# Patient Record
Sex: Male | Born: 1955 | Race: White | Hispanic: No | Marital: Married
Health system: Midwestern US, Academic
[De-identification: ages and names within clinical notes are randomized; demographics above are authoritative.]

---

## 2017-12-22 ENCOUNTER — Encounter: Admit: 2017-12-22 | Discharge: 2017-12-22

## 2017-12-22 ENCOUNTER — Encounter: Admit: 2017-12-22 | Discharge: 2017-12-23

## 2017-12-22 DIAGNOSIS — S52511A Displaced fracture of right radial styloid process, initial encounter for closed fracture: ICD-10-CM

## 2017-12-22 DIAGNOSIS — I10 Essential (primary) hypertension: Principal | ICD-10-CM

## 2017-12-22 DIAGNOSIS — S52611A Displaced fracture of right ulna styloid process, initial encounter for closed fracture: Secondary | ICD-10-CM

## 2017-12-22 DIAGNOSIS — S471XXA Crushing injury of right shoulder and upper arm, initial encounter: ICD-10-CM

## 2017-12-22 DIAGNOSIS — E079 Disorder of thyroid, unspecified: ICD-10-CM

## 2017-12-22 MED ORDER — DIPHTH,PERTUS(ACELL),TETANUS 2.5-8-5 LF-MCG-LF/0.5ML IM SUSP
.5 mL | Freq: Once | INTRAMUSCULAR | 0 refills | Status: CP
Start: 2017-12-22 — End: ?
  Administered 2017-12-23: 16:00:00 0.5 mL via INTRAMUSCULAR

## 2017-12-22 MED ORDER — OXYCODONE-ACETAMINOPHEN 5-325 MG PO TAB
1 | ORAL | 0 refills | Status: DC | PRN
Start: 2017-12-22 — End: 2017-12-24
  Administered 2017-12-23 – 2017-12-24 (×6): 1 via ORAL

## 2017-12-22 MED ORDER — FENTANYL CITRATE (PF) 50 MCG/ML IJ SOLN
25 ug | INTRAVENOUS | 0 refills | Status: DC | PRN
Start: 2017-12-22 — End: 2017-12-24
  Administered 2017-12-23 (×3): 25 ug via INTRAVENOUS

## 2017-12-22 MED ORDER — ENOXAPARIN 40 MG/0.4 ML SC SYRG
40 mg | Freq: Every day | SUBCUTANEOUS | 0 refills | Status: DC
Start: 2017-12-22 — End: 2017-12-24
  Administered 2017-12-24: 03:00:00 40 mg via SUBCUTANEOUS

## 2017-12-22 MED ORDER — LACTATED RINGERS IV SOLP
INTRAVENOUS | 0 refills | Status: AC
Start: 2017-12-22 — End: ?
  Administered 2017-12-23 (×2): 1000.000 mL via INTRAVENOUS

## 2017-12-23 ENCOUNTER — Inpatient Hospital Stay
Admit: 2017-12-23 | Discharge: 2017-12-24 | Disposition: A | Discharge status: Home / Self Care | Payer: BC Managed Care – PPO | Source: Other Acute Inpatient Hospital

## 2017-12-23 ENCOUNTER — Encounter: Admit: 2017-12-23 | Discharge: 2017-12-23

## 2017-12-23 ENCOUNTER — Inpatient Hospital Stay: Admit: 2017-12-23 | Discharge: 2017-12-23

## 2017-12-23 ENCOUNTER — Encounter: Admit: 2017-12-23 | Discharge: 2017-12-24

## 2017-12-23 DIAGNOSIS — E079 Disorder of thyroid, unspecified: ICD-10-CM

## 2017-12-23 DIAGNOSIS — I10 Essential (primary) hypertension: Principal | ICD-10-CM

## 2017-12-23 LAB — CBC
Lab: 12 10*3/uL — ABNORMAL HIGH (ref 4.5–11.0)
Lab: 11 10*3/uL — ABNORMAL HIGH (ref 4.5–11.0)
Lab: 13 % (ref 11–15)
Lab: 14 g/dL (ref 13.5–16.5)
Lab: 34 g/dL (ref 32.0–36.0)
Lab: 367 10*3/uL (ref 150–400)
Lab: 4.5 M/UL (ref 4.4–5.5)
Lab: 7.5 FL (ref 7–11)
Lab: 93 FL (ref 80–100)

## 2017-12-23 LAB — BASIC METABOLIC PANEL
Lab: 1.6 mg/dL — ABNORMAL HIGH (ref 0.4–1.24)
Lab: 106 MMOL/L (ref 98–110)
Lab: 11 g/dL (ref 3–12)
Lab: 111 mg/dL — ABNORMAL HIGH (ref 70–100)
Lab: 138 MMOL/L (ref 137–147)
Lab: 21 MMOL/L (ref 21–30)
Lab: 43 mL/min — ABNORMAL LOW (ref >60)
Lab: 45 mg/dL — ABNORMAL HIGH (ref 7–25)
Lab: 52 mL/min — ABNORMAL LOW (ref >60)
Lab: 9.4 mg/dL (ref 8.5–10.6)
Lab: 135 MMOL/L — ABNORMAL LOW (ref >40)
Lab: 1.3 mg/dL — ABNORMAL HIGH (ref 0.4–1.24)
Lab: 101 mg/dL — ABNORMAL HIGH (ref 70–100)
Lab: 107 MMOL/L (ref 98–110)
Lab: 137 MMOL/L (ref 137–147)
Lab: 20 MMOL/L — ABNORMAL LOW (ref 21–30)
Lab: 35 mg/dL — ABNORMAL HIGH (ref 7–25)
Lab: 4.1 MMOL/L (ref 3.5–5.1)
Lab: 56 mL/min — ABNORMAL LOW (ref >60)
Lab: 9 mg/dL (ref 8.5–10.6)
Lab: >60 mL/min (ref >60)
Lab: 10 (ref 3–12)

## 2017-12-23 LAB — PTT (APTT)
Lab: 30 s (ref 24.0–36.5)
Lab: 23 s — ABNORMAL LOW (ref 24.0–36.5)

## 2017-12-23 LAB — CREATINE KINASE-CPK: Lab: 118 U/L (ref 35–232)

## 2017-12-23 LAB — PROTIME INR (PT)
Lab: 1 MMOL/L (ref 0.8–1.2)
Lab: 1 MMOL/L — ABNORMAL HIGH (ref >60)

## 2017-12-23 MED ORDER — ROPIVACAINE (PF) 5 MG/ML (0.5 %) IJ SOLN
0 refills | Status: DC
Start: 2017-12-23 — End: 2017-12-23
  Administered 2017-12-23: 23:00:00 15 mL

## 2017-12-23 MED ORDER — DEXTRAN 70-HYPROMELLOSE (PF) 0.1-0.3 % OP DPET
0 refills | Status: DC
Start: 2017-12-23 — End: 2017-12-23
  Administered 2017-12-23: 19:00:00 2 [drp] via OPHTHALMIC

## 2017-12-23 MED ORDER — PROPOFOL INJ 10 MG/ML IV VIAL
0 refills | Status: DC
Start: 2017-12-23 — End: 2017-12-23
  Administered 2017-12-23: 19:00:00 200 mg via INTRAVENOUS

## 2017-12-23 MED ORDER — CEFAZOLIN 1 GRAM IJ SOLR
0 refills | Status: DC
Start: 2017-12-23 — End: 2017-12-23
  Administered 2017-12-23: 20:00:00 2 g via INTRAVENOUS

## 2017-12-23 MED ORDER — FENTANYL CITRATE (PF) 50 MCG/ML IJ SOLN
25 ug | INTRAVENOUS | 0 refills | Status: DC | PRN
Start: 2017-12-23 — End: 2017-12-23

## 2017-12-23 MED ORDER — DEXAMETHASONE SODIUM PHOSPHATE 4 MG/ML IJ SOLN
INTRAVENOUS | 0 refills | Status: DC
Start: 2017-12-23 — End: 2017-12-23
  Administered 2017-12-23: 20:00:00 4 mg via INTRAVENOUS

## 2017-12-23 MED ORDER — OXYCODONE 5 MG PO TAB
5-10 mg | Freq: Once | ORAL | 0 refills | Status: DC | PRN
Start: 2017-12-23 — End: 2017-12-23

## 2017-12-23 MED ORDER — ONDANSETRON HCL (PF) 4 MG/2 ML IJ SOLN
INTRAVENOUS | 0 refills | Status: DC
Start: 2017-12-23 — End: 2017-12-23
  Administered 2017-12-23: 21:00:00 4 mg via INTRAVENOUS

## 2017-12-23 MED ORDER — LACTATED RINGERS IV SOLP
1000 mL | INTRAVENOUS | 0 refills | Status: DC
Start: 2017-12-23 — End: 2017-12-24
  Administered 2017-12-23: 19:00:00 1000 mL via INTRAVENOUS

## 2017-12-23 MED ORDER — FENTANYL CITRATE (PF) 50 MCG/ML IJ SOLN
50 ug | INTRAVENOUS | 0 refills | Status: DC | PRN
Start: 2017-12-23 — End: 2017-12-23

## 2017-12-23 MED ORDER — KETAMINE 10 MG/ML IJ SOLN
0 refills | Status: DC
Start: 2017-12-23 — End: 2017-12-23
  Administered 2017-12-23: 19:00:00 30 mg via INTRAVENOUS

## 2017-12-23 MED ORDER — HYDROMORPHONE (PF) 2 MG/ML IJ SYRG
.5-1 mg | INTRAVENOUS | 0 refills | Status: DC | PRN
Start: 2017-12-23 — End: 2017-12-23
  Administered 2017-12-23: 23:00:00 1 mg via INTRAVENOUS

## 2017-12-23 MED ORDER — LIDOCAINE (PF) 200 MG/10 ML (2 %) IJ SYRG
0 refills | Status: DC
Start: 2017-12-23 — End: 2017-12-23
  Administered 2017-12-23: 19:00:00 100 mg via INTRAVENOUS

## 2017-12-23 MED ORDER — PROMETHAZINE 25 MG/ML IJ SOLN
6.25 mg | INTRAVENOUS | 0 refills | Status: DC | PRN
Start: 2017-12-23 — End: 2017-12-23

## 2017-12-23 MED ORDER — HALOPERIDOL LACTATE 5 MG/ML IJ SOLN
1 mg | Freq: Once | INTRAVENOUS | 0 refills | Status: DC | PRN
Start: 2017-12-23 — End: 2017-12-23

## 2017-12-23 MED ORDER — EPHEDRINE SULFATE 50 MG/5ML SYR (10 MG/ML) (AN)(OSM)
0 refills | Status: DC
Start: 2017-12-23 — End: 2017-12-23
  Administered 2017-12-23 (×2): 5 mg via INTRAVENOUS

## 2017-12-23 MED ORDER — PHENYLEPHRINE IN 0.9% NACL(PF) 1 MG/10 ML (100 MCG/ML) IV SYRG
INTRAVENOUS | 0 refills | Status: DC
Start: 2017-12-23 — End: 2017-12-23
  Administered 2017-12-23 (×4): 100 ug via INTRAVENOUS

## 2017-12-23 MED ORDER — FENTANYL CITRATE (PF) 50 MCG/ML IJ SOLN
0 refills | Status: DC
Start: 2017-12-23 — End: 2017-12-23
  Administered 2017-12-23 (×2): 25 ug via INTRAVENOUS
  Administered 2017-12-23: 20:00:00 50 ug via INTRAVENOUS
  Administered 2017-12-23: 22:00:00 75 ug via INTRAVENOUS

## 2017-12-23 MED ORDER — LIDOCAINE (PF) 10 MG/ML (1 %) IJ SOLN
SUBCUTANEOUS | 0 refills | Status: DC
Start: 2017-12-23 — End: 2017-12-23
  Administered 2017-12-23: 23:00:00 5 mL via SUBCUTANEOUS

## 2017-12-23 MED ORDER — DIPHENHYDRAMINE HCL 50 MG/ML IJ SOLN
25 mg | Freq: Once | INTRAVENOUS | 0 refills | Status: DC | PRN
Start: 2017-12-23 — End: 2017-12-23

## 2017-12-24 ENCOUNTER — Inpatient Hospital Stay: Admit: 2017-12-23 | Discharge: 2017-12-23

## 2017-12-24 ENCOUNTER — Inpatient Hospital Stay: Admit: 2017-12-22 | Discharge: 2017-12-22

## 2017-12-24 ENCOUNTER — Encounter: Admit: 2017-12-24 | Discharge: 2017-12-24

## 2017-12-24 DIAGNOSIS — S63391A Traumatic rupture of other ligament of right wrist, initial encounter: Principal | ICD-10-CM

## 2017-12-24 DIAGNOSIS — R402143 Coma scale, eyes open, spontaneous, at hospital admission: ICD-10-CM

## 2017-12-24 DIAGNOSIS — F17200 Nicotine dependence, unspecified, uncomplicated: ICD-10-CM

## 2017-12-24 DIAGNOSIS — Z23 Encounter for immunization: ICD-10-CM

## 2017-12-24 DIAGNOSIS — R402253 Coma scale, best verbal response, oriented, at hospital admission: ICD-10-CM

## 2017-12-24 DIAGNOSIS — S52513A Displaced fracture of unspecified radial styloid process, initial encounter for closed fracture: ICD-10-CM

## 2017-12-24 DIAGNOSIS — E039 Hypothyroidism, unspecified: ICD-10-CM

## 2017-12-24 DIAGNOSIS — I10 Essential (primary) hypertension: ICD-10-CM

## 2017-12-24 DIAGNOSIS — S52611A Displaced fracture of right ulna styloid process, initial encounter for closed fracture: Secondary | ICD-10-CM

## 2017-12-24 DIAGNOSIS — R402363 Coma scale, best motor response, obeys commands, at hospital admission: ICD-10-CM

## 2017-12-24 DIAGNOSIS — G8911 Acute pain due to trauma: ICD-10-CM

## 2017-12-24 LAB — PTT (APTT): Lab: 31 s — ABNORMAL LOW (ref >60)

## 2017-12-24 LAB — CBC: Lab: 14 K/UL — ABNORMAL HIGH (ref >60)

## 2017-12-24 LAB — PROTIME INR (PT): Lab: 1 MMOL/L — ABNORMAL LOW (ref >60)

## 2017-12-24 LAB — PHOSPHORUS: Lab: 3.1 mg/dL (ref 2.0–4.5)

## 2017-12-24 LAB — BASIC METABOLIC PANEL: Lab: 137 MMOL/L — ABNORMAL LOW (ref 137–147)

## 2017-12-24 LAB — MAGNESIUM: Lab: 1.8 mg/dL (ref 1.6–2.6)

## 2017-12-24 MED ORDER — POLYETHYLENE GLYCOL 3350 17 GRAM PO PWPK
1 | Freq: Every day | ORAL | 0 refills | Status: DC
Start: 2017-12-24 — End: 2017-12-24
  Administered 2017-12-24: 13:00:00 17 g via ORAL

## 2017-12-24 MED ORDER — GABAPENTIN 100 MG PO CAP
200 mg | ORAL | 0 refills | Status: DC
Start: 2017-12-24 — End: 2017-12-24
  Administered 2017-12-24: 20:00:00 200 mg via ORAL

## 2017-12-24 MED ORDER — MAGNESIUM SULFATE IN D5W 1 GRAM/100 ML IV PGBK
1 g | Freq: Once | INTRAVENOUS | 0 refills | Status: CP
Start: 2017-12-24 — End: ?
  Administered 2017-12-24: 16:00:00 1 g via INTRAVENOUS

## 2017-12-24 MED ORDER — POLYETHYLENE GLYCOL 3350 17 GRAM PO PWPK
17 g | Freq: Every day | ORAL | 0 refills | 18.00000 days | Status: AC
Start: 2017-12-24 — End: ?

## 2017-12-24 MED ORDER — OXYCODONE-ACETAMINOPHEN 5-325 MG PO TAB
1-2 | ORAL_TABLET | ORAL | 0 refills | 2.00000 days | Status: AC | PRN
Start: 2017-12-24 — End: 2018-02-19
  Filled 2017-12-24 (×2): qty 60, 5d supply, fill #1

## 2017-12-24 MED ORDER — SENNOSIDES 8.6 MG PO TAB
1 | Freq: Two times a day (BID) | ORAL | 0 refills | Status: DC
Start: 2017-12-24 — End: 2017-12-24
  Administered 2017-12-24: 13:00:00 1 via ORAL

## 2017-12-24 MED ORDER — SENNOSIDES 8.6 MG PO TAB
1 | ORAL_TABLET | Freq: Two times a day (BID) | ORAL | 0 refills | 30.00000 days | Status: AC
Start: 2017-12-24 — End: ?

## 2017-12-24 MED ORDER — GABAPENTIN 100 MG PO CAP
200 mg | ORAL_CAPSULE | ORAL | 0 refills | Status: AC
Start: 2017-12-24 — End: ?
  Filled 2017-12-24 (×2): qty 168, 28d supply, fill #1

## 2017-12-25 ENCOUNTER — Encounter: Admit: 2017-12-25 | Discharge: 2017-12-25

## 2017-12-25 DIAGNOSIS — I10 Essential (primary) hypertension: Principal | ICD-10-CM

## 2017-12-25 DIAGNOSIS — E079 Disorder of thyroid, unspecified: ICD-10-CM

## 2018-01-05 ENCOUNTER — Encounter: Admit: 2018-01-05 | Discharge: 2018-01-05

## 2018-01-05 DIAGNOSIS — S63094A Other dislocation of right wrist and hand, initial encounter: Principal | ICD-10-CM

## 2018-01-08 ENCOUNTER — Ambulatory Visit: Admit: 2018-01-08 | Discharge: 2018-01-08

## 2018-01-08 ENCOUNTER — Ambulatory Visit: Admit: 2018-01-08 | Discharge: 2018-01-08 | Payer: BC Managed Care – PPO

## 2018-01-08 DIAGNOSIS — S63094A Other dislocation of right wrist and hand, initial encounter: Principal | ICD-10-CM

## 2018-01-08 DIAGNOSIS — S63094D Other dislocation of right wrist and hand, subsequent encounter: ICD-10-CM

## 2018-01-19 ENCOUNTER — Encounter: Admit: 2018-01-19 | Discharge: 2018-01-19

## 2018-01-19 DIAGNOSIS — S63094D Other dislocation of right wrist and hand, subsequent encounter: Secondary | ICD-10-CM

## 2018-01-19 MED ORDER — CEFAZOLIN 1 GRAM IJ SOLR
2 g | Freq: Once | INTRAVENOUS | 0 refills | Status: CN
Start: 2018-01-19 — End: ?

## 2018-01-29 ENCOUNTER — Ambulatory Visit: Admit: 2018-01-29 | Discharge: 2018-01-30 | Payer: BC Managed Care – PPO

## 2018-01-29 DIAGNOSIS — S52611D Displaced fracture of right ulna styloid process, subsequent encounter for closed fracture with routine healing: Secondary | ICD-10-CM

## 2018-02-19 ENCOUNTER — Encounter: Admit: 2018-02-19 | Discharge: 2018-02-19

## 2018-02-19 DIAGNOSIS — I10 Essential (primary) hypertension: Principal | ICD-10-CM

## 2018-02-19 DIAGNOSIS — E079 Disorder of thyroid, unspecified: ICD-10-CM

## 2018-02-21 ENCOUNTER — Encounter: Admit: 2018-02-21 | Discharge: 2018-02-21

## 2018-02-23 ENCOUNTER — Encounter: Admit: 2018-02-23 | Discharge: 2018-02-23

## 2018-02-23 DIAGNOSIS — E079 Disorder of thyroid, unspecified: ICD-10-CM

## 2018-02-23 DIAGNOSIS — I10 Essential (primary) hypertension: Principal | ICD-10-CM

## 2018-02-23 MED ORDER — CEFAZOLIN 1 GRAM IJ SOLR
2 g | Freq: Once | INTRAVENOUS | 0 refills | Status: CP
Start: 2018-02-23 — End: ?

## 2018-02-23 MED ORDER — PROMETHAZINE 25 MG/ML IJ SOLN
6.25 mg | INTRAVENOUS | 0 refills | Status: DC | PRN
Start: 2018-02-23 — End: 2018-02-28

## 2018-02-23 MED ORDER — ONDANSETRON HCL (PF) 4 MG/2 ML IJ SOLN
4 mg | Freq: Once | INTRAVENOUS | 0 refills | Status: AC | PRN
Start: 2018-02-23 — End: ?

## 2018-02-23 MED ORDER — FENTANYL CITRATE (PF) 50 MCG/ML IJ SOLN
0 refills | Status: DC
Start: 2018-02-23 — End: 2018-02-23

## 2018-02-23 MED ORDER — OXYCODONE-ACETAMINOPHEN 5-325 MG PO TAB
1 | ORAL_TABLET | ORAL | 0 refills | 2.00000 days | Status: AC | PRN
Start: 2018-02-23 — End: ?
  Filled 2018-02-23 (×2): qty 15, 3d supply, fill #1

## 2018-02-23 MED ORDER — PROPOFOL 10 MG/ML IV EMUL 20 ML (INFUSION)(AM)(OR)
INTRAVENOUS | 0 refills | Status: DC
Start: 2018-02-23 — End: 2018-02-23

## 2018-02-23 MED ORDER — OXYCODONE 5 MG PO TAB
5-10 mg | Freq: Once | ORAL | 0 refills | Status: CP | PRN
Start: 2018-02-23 — End: ?

## 2018-02-23 MED ORDER — LACTATED RINGERS IV SOLP
1000 mL | INTRAVENOUS | 0 refills | Status: DC
Start: 2018-02-23 — End: 2018-02-28

## 2018-02-23 MED ORDER — MIDAZOLAM 1 MG/ML IJ SOLN
INTRAVENOUS | 0 refills | Status: DC
Start: 2018-02-23 — End: 2018-02-23

## 2018-02-23 MED ORDER — HYDROMORPHONE (PF) 2 MG/ML IJ SYRG
.5-1 mg | INTRAVENOUS | 0 refills | Status: DC | PRN
Start: 2018-02-23 — End: 2018-02-28

## 2018-02-23 MED ORDER — DOCUSATE SODIUM 100 MG PO CAP
100 mg | ORAL_CAPSULE | Freq: Two times a day (BID) | ORAL | 0 refills | Status: AC
Start: 2018-02-23 — End: ?

## 2018-02-23 MED ORDER — HALOPERIDOL LACTATE 5 MG/ML IJ SOLN
1 mg | Freq: Once | INTRAVENOUS | 0 refills | Status: AC | PRN
Start: 2018-02-23 — End: ?

## 2018-02-23 MED ORDER — LIDOCAINE (PF) 5 MG/ML (0.5 %) IJ SOLN
0 refills | Status: DC
Start: 2018-02-23 — End: 2018-02-23

## 2018-02-23 MED ORDER — FENTANYL CITRATE (PF) 50 MCG/ML IJ SOLN
25 ug | INTRAVENOUS | 0 refills | Status: DC | PRN
Start: 2018-02-23 — End: 2018-02-28

## 2018-02-23 MED ORDER — FENTANYL CITRATE (PF) 50 MCG/ML IJ SOLN
50 ug | INTRAVENOUS | 0 refills | Status: DC | PRN
Start: 2018-02-23 — End: 2018-02-28

## 2018-02-23 NOTE — Anesthesia Procedure Notes
Anesthesia Procedure: Peripheral Nerve Block    PERIPHERAL NERVE BLOCK  Date/Time: 02/23/2018 7:38 AM    Patient location: OR  Reason for block: at surgeon's request and post-op pain management    Preprocedure checklist performed: 2 patient identifiers, risks & benefits discussed, patient evaluated, timeout performed, consent obtained, patient being monitored and sterile drape    Sterile technique:  - Proper hand washing  - Cap, mask  - Sterile gloves  - Skin prep for antisepsis        Peripheral Nerve Block Procedure   Patient position: supine  Prep: ChloraPrep    Monitoring: BP, EKG and continuous pulse ox  Block type: IVRA (Bier)  Laterality: right  Injection technique: single-shot    Procedure Outcome   Injection assessment: negative aspiration for heme, no paresthesia on injection, incremental injection and local visualized surrounding nerve on ultrasound  Observations: adequate block, patient sedated but conversant throughout block, patient tolerated the procedure well with no immediate complications and comfortable throughout block    Additional notes: ATTESTATION    I personally performed the procedure myself.    Staff name:  Dionicio Stall, MD Date:  02/23/2018    Refer to nursing documentation for vitals and monitoring data during procedure.    Performed by: Dionicio Stall, MD  Authorized by: Dionicio Stall, MD

## 2018-02-24 ENCOUNTER — Ambulatory Visit: Admit: 2018-02-23 | Discharge: 2018-02-24 | Payer: BC Managed Care – PPO

## 2018-02-24 DIAGNOSIS — S63094D Other dislocation of right wrist and hand, subsequent encounter: Secondary | ICD-10-CM

## 2018-02-26 ENCOUNTER — Encounter: Admit: 2018-02-26 | Discharge: 2018-02-26

## 2018-02-26 DIAGNOSIS — I10 Essential (primary) hypertension: Principal | ICD-10-CM

## 2018-02-26 DIAGNOSIS — E079 Disorder of thyroid, unspecified: ICD-10-CM

## 2018-03-06 ENCOUNTER — Encounter: Admit: 2018-03-06 | Discharge: 2018-03-17 | Payer: BC Managed Care – PPO

## 2018-03-06 ENCOUNTER — Ambulatory Visit: Admit: 2018-03-06 | Discharge: 2018-03-07 | Payer: BC Managed Care – PPO

## 2018-03-06 ENCOUNTER — Encounter: Admit: 2018-03-06 | Discharge: 2018-03-06

## 2018-03-06 DIAGNOSIS — Z9889 Other specified postprocedural states: ICD-10-CM

## 2018-03-06 DIAGNOSIS — S63094D Other dislocation of right wrist and hand, subsequent encounter: Principal | ICD-10-CM

## 2018-03-06 NOTE — Progress Notes
Patient here for suture removal. Surgical incision well approximated no s/s infection. Patient with no complaints. Therapy orders reviewed, follow up appt made for six weeks.

## 2018-03-17 DIAGNOSIS — Z9889 Other specified postprocedural states: ICD-10-CM

## 2018-03-17 DIAGNOSIS — S63094D Other dislocation of right wrist and hand, subsequent encounter: Principal | ICD-10-CM

## 2019-12-24 IMAGING — CR UP_EXM
2 series · 2 of 2 positions shown · non-contrast
Comparison: none

[wrist pa]
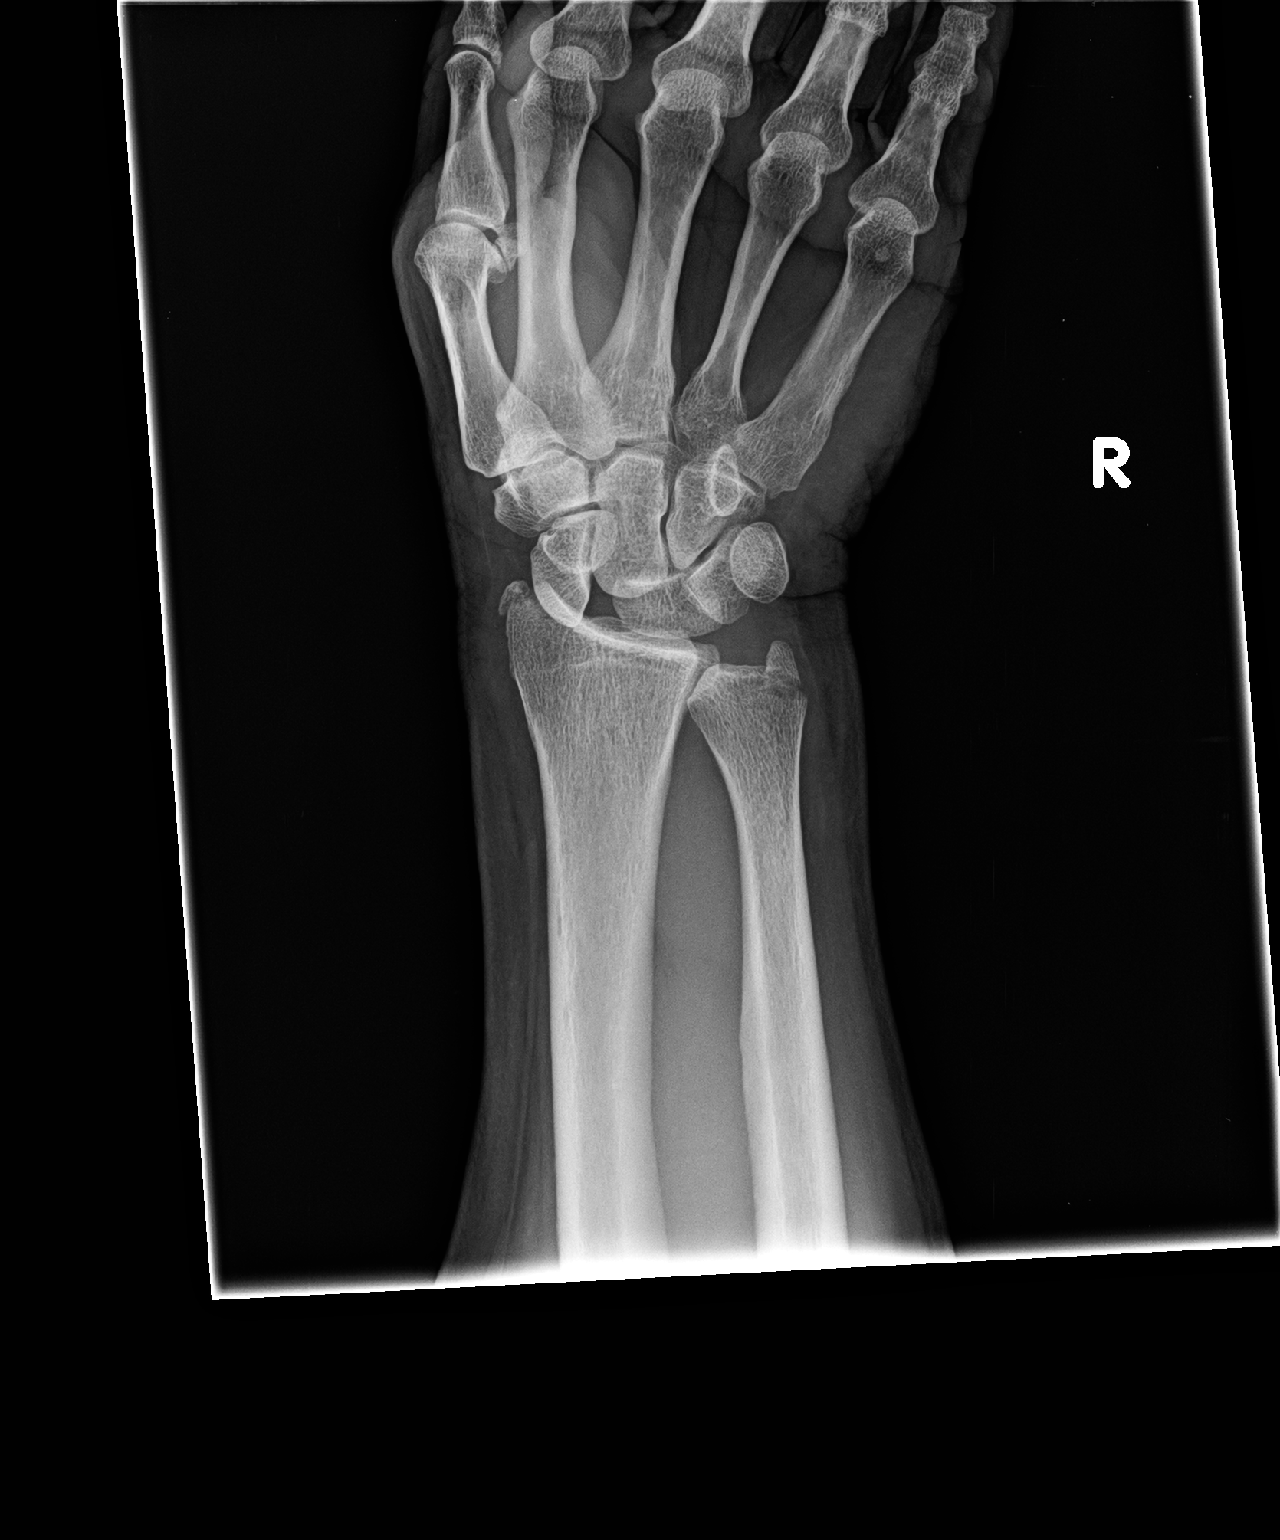

[wrist lat]
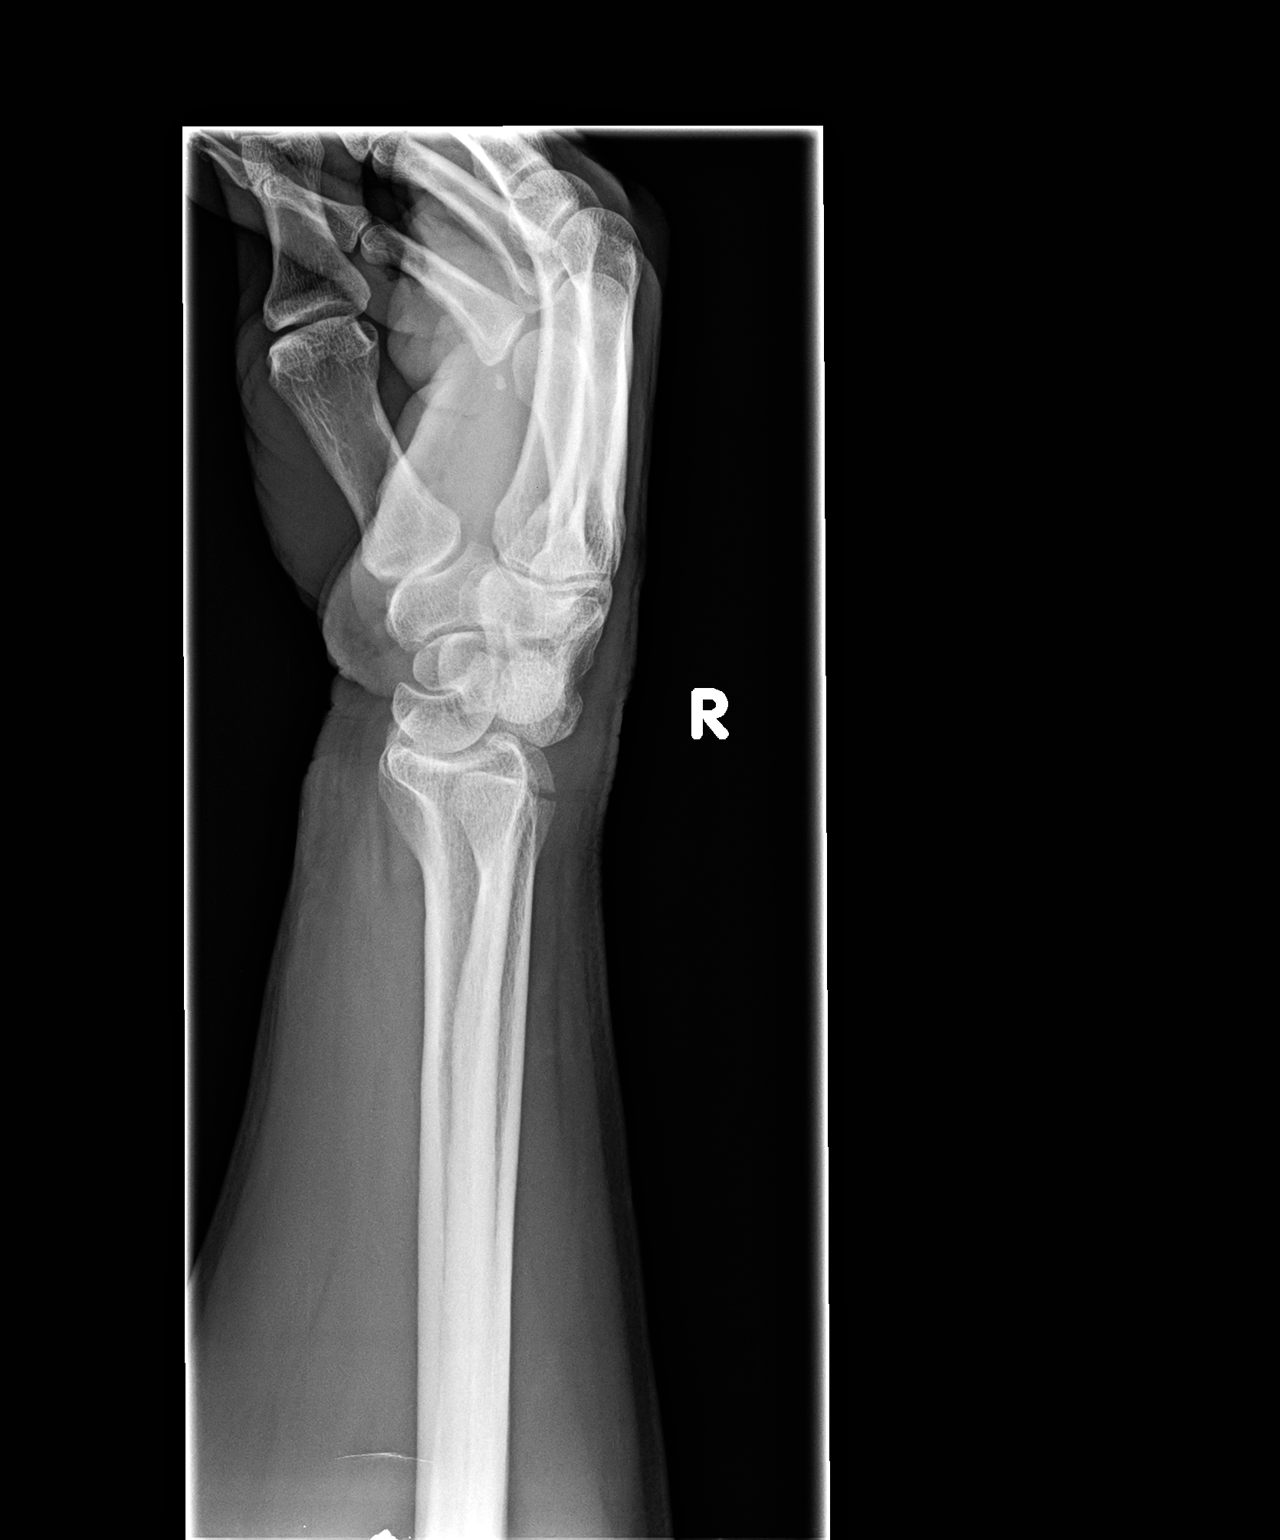

[2 of 2 positions shown; findings below may reference images not displayed]

EXAM

3 views  right wrist

INDICATION

car fell on wrist
Pt c/o right wrist pain. Car fell on wrist. CC/CK

TECHNIQUE

AP, lateral, and oblique views were obtained

COMPARISONS

None available.

FINDINGS

There is nondisplaced fractures of the radial and ulnar styloid process with associated marked para
carpal soft tissue swelling. There is widening of the scaphoid lunate interval with likely anterior
dislocation of the scaphoid bone.

IMPRESSION

Demonstration of nondisplaced radial and ulnar styloid avulsion fractures with likely carpal
ligamentous disruption and anterior dislocation of the scaphoid bone from the proximal carpal row.

Tech Notes:

Pt c/o right wrist pain. Car fell on wrist. CC/CK

## 2019-12-24 IMAGING — CR UP_EXM
2 series · 2 of 2 positions shown · non-contrast
Comparison: none

[wrist pa]
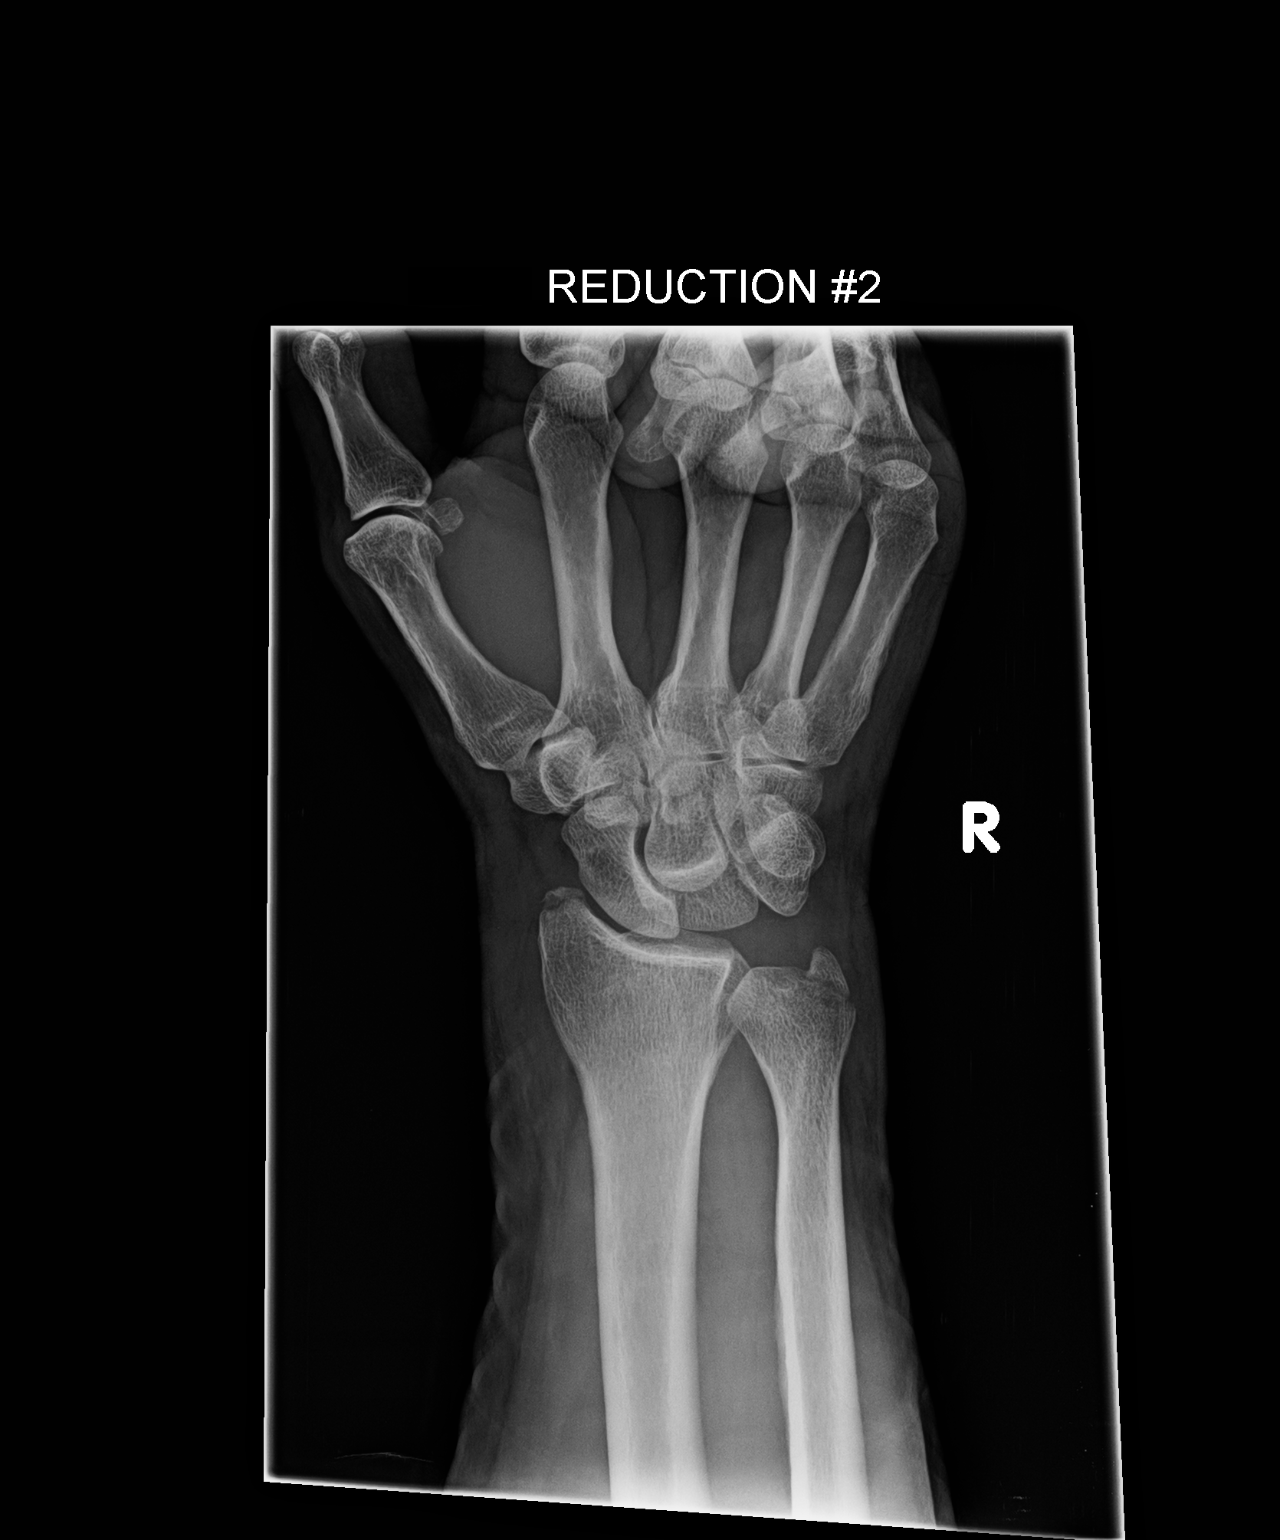

[wrist lat]
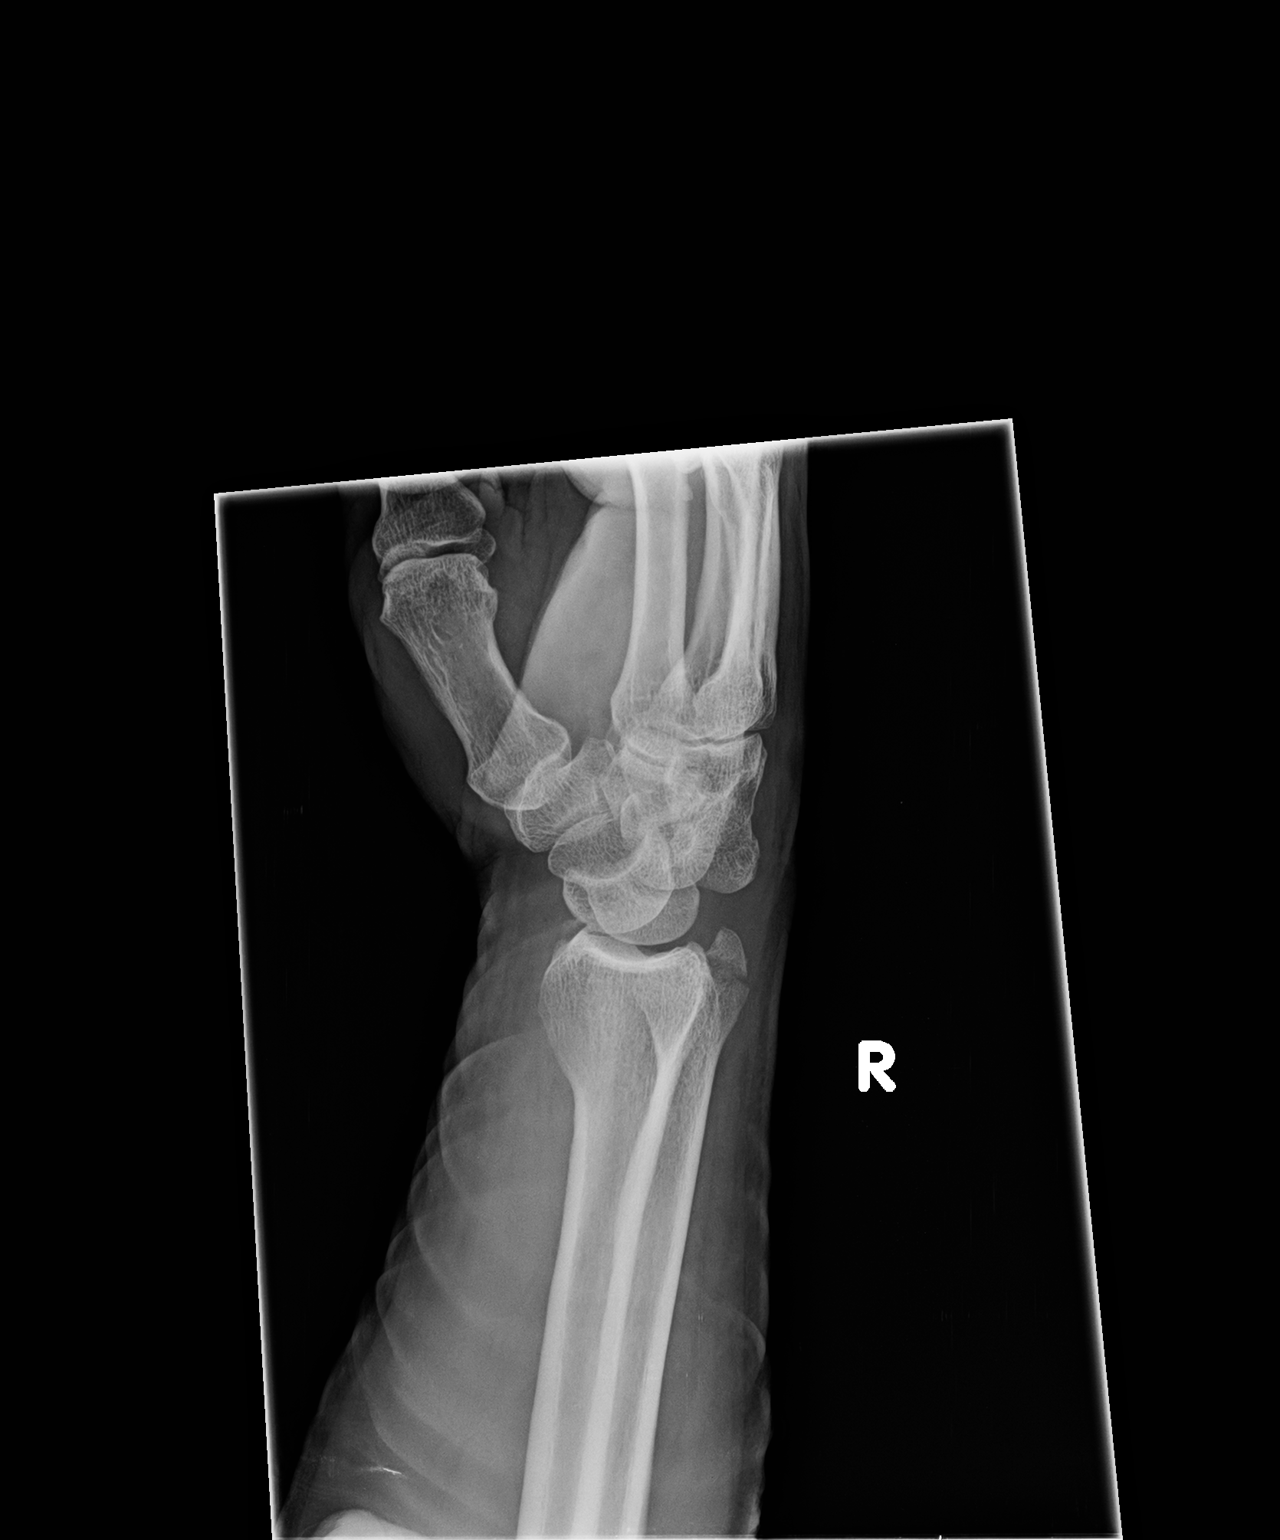

[2 of 2 positions shown; findings below may reference images not displayed]

12/22/17

EXAM

2 views  right wrist

INDICATION

2nd post reduction
POST REDUCTION #2. ME

TECHNIQUE

AP and lateral views were obtained

COMPARISONS

Two views right wrist December 22, 2017

FINDINGS

Redemonstration of nondisplaced fractures of the radial styloid and ulnar styloid process ease.
Satisfactory position of the scaphoid bone with persistent anterior dislocation of the lunate bone.
There is associated para carpal soft tissue swelling..

IMPRESSION

1. Unchanged fractures of the distal radius and ulna with persistent anterior dislocation of the
lunate bone.

Tech Notes:

POST REDUCTION #2. ME

## 2019-12-24 IMAGING — CR UP_EXM
2 series · 2 of 2 positions shown · non-contrast
Comparison: none

[wrist lat]
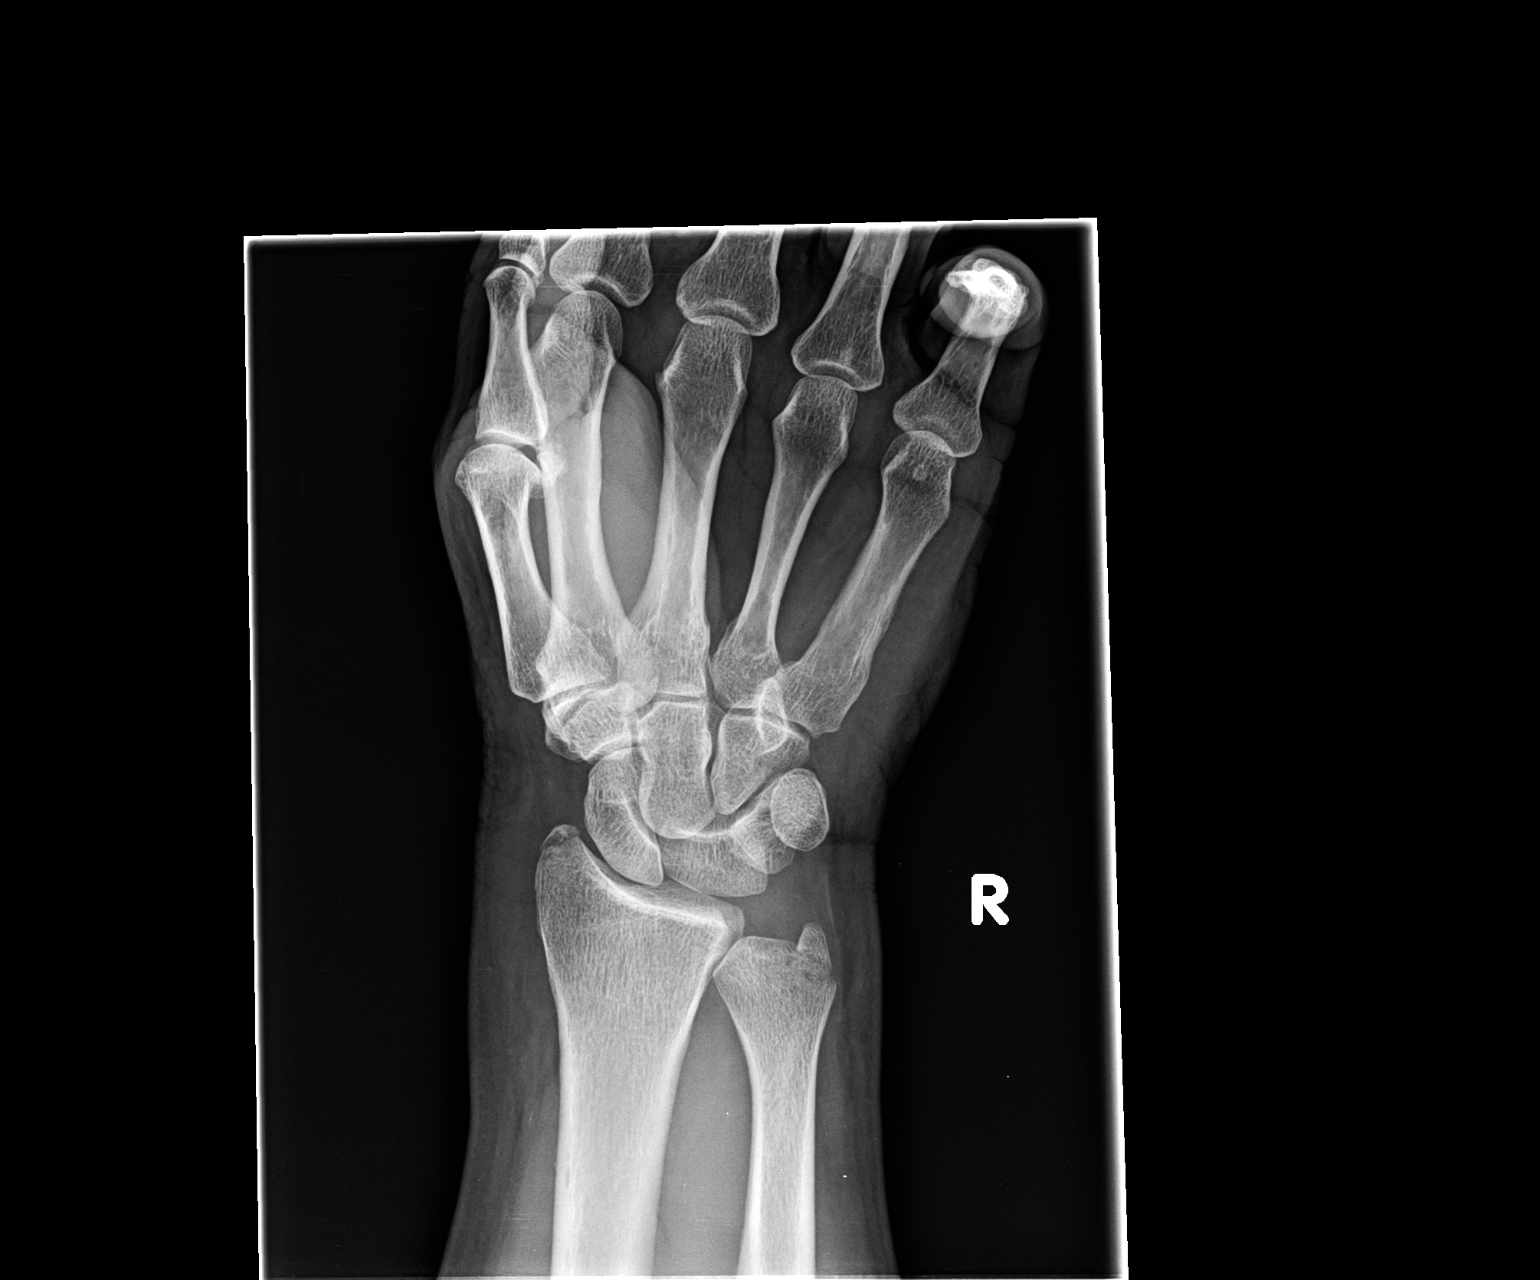

[wrist pa]
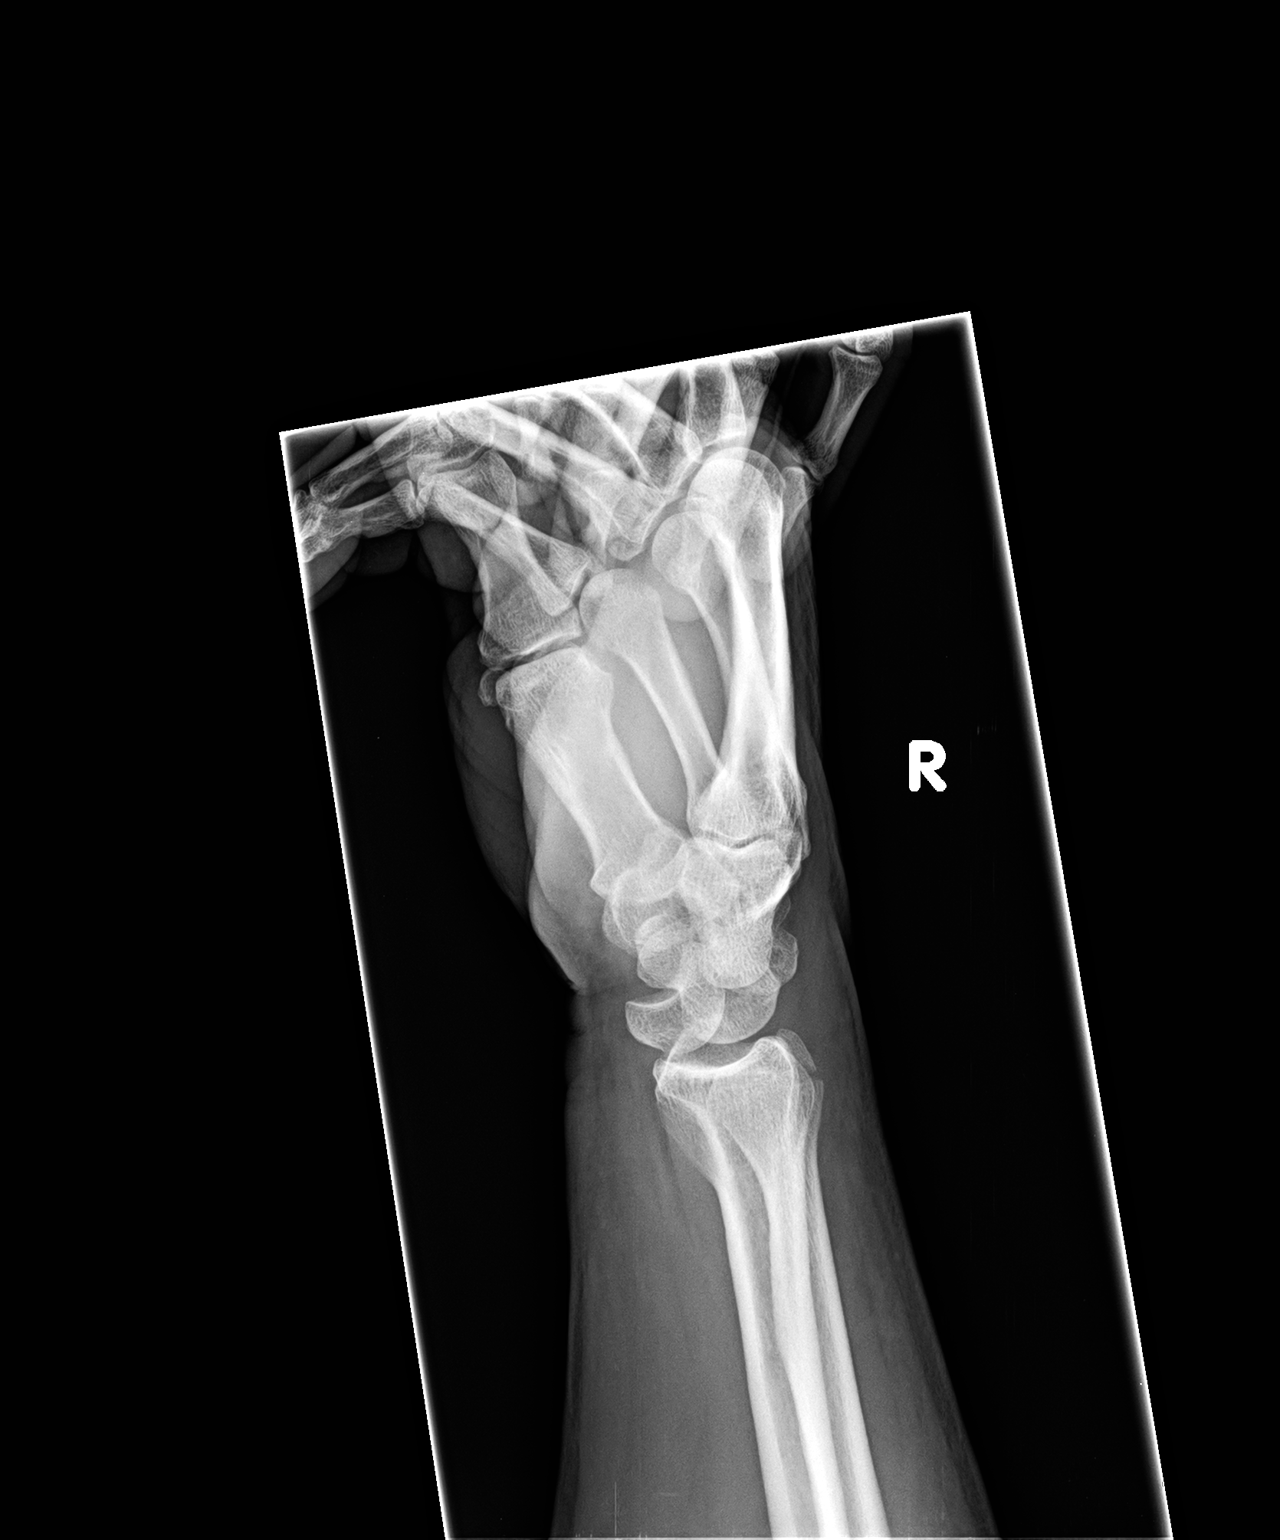

[2 of 2 positions shown; findings below may reference images not displayed]

12/22/17

EXAM

2 views  right wrist

INDICATION

post red
POST REDUCTION. CC/CK

TECHNIQUE

AP and lateral views were obtained

COMPARISONS

Two views right wrist December 22, 2017

FINDINGS

There is redemonstration of nondisplaced avulsion fractures of the radial styloid and ulnar styloid
processes. There is slightly improved alignment of the scaphoid bone with likely persistent mild
anterior dislocation of the lunate bone. There is persistent associated para carpal soft tissue
swelling.

IMPRESSION

1. Improved alignment of the scaphoid with persistent dislocation of the lunate bone. Unchanged
nondisplaced ulnar styloid and radial styloid fractures with associated soft tissue swelling.

Tech Notes:

POST REDUCTION. CC/CK
